# Patient Record
Sex: Female | Born: 1950 | Race: White | Hispanic: No | Marital: Married | Smoking: Never smoker
Health system: Southern US, Community
[De-identification: ages and names within clinical notes are randomized; demographics above are authoritative.]

## PROBLEM LIST (undated history)

## (undated) DIAGNOSIS — G20A1 Parkinson's disease without dyskinesia, without mention of fluctuations: Secondary | ICD-10-CM

---

## 2008-08-13 ENCOUNTER — Ambulatory Visit: Payer: Self-pay | Admitting: Gastroenterology

## 2008-09-10 ENCOUNTER — Ambulatory Visit: Payer: Self-pay | Admitting: Gastroenterology

## 2009-05-22 ENCOUNTER — Ambulatory Visit: Payer: Self-pay | Admitting: Neurology

## 2011-06-13 ENCOUNTER — Emergency Department: Payer: Self-pay | Admitting: Emergency Medicine

## 2012-03-13 DIAGNOSIS — M545 Low back pain: Secondary | ICD-10-CM

## 2012-03-13 DIAGNOSIS — M542 Cervicalgia: Secondary | ICD-10-CM

## 2012-03-13 DIAGNOSIS — F1190 Opioid use, unspecified, uncomplicated: Secondary | ICD-10-CM | POA: Insufficient documentation

## 2012-03-13 DIAGNOSIS — F329 Major depressive disorder, single episode, unspecified: Secondary | ICD-10-CM | POA: Insufficient documentation

## 2012-03-13 DIAGNOSIS — G8929 Other chronic pain: Secondary | ICD-10-CM | POA: Insufficient documentation

## 2012-08-08 DIAGNOSIS — IMO0002 Reserved for concepts with insufficient information to code with codable children: Secondary | ICD-10-CM | POA: Insufficient documentation

## 2012-09-28 ENCOUNTER — Ambulatory Visit: Payer: Self-pay | Admitting: Family Medicine

## 2013-05-14 DIAGNOSIS — H5711 Ocular pain, right eye: Secondary | ICD-10-CM | POA: Insufficient documentation

## 2014-07-06 IMAGING — CT CT ABDOMEN W/ CM
1 of 2 series · 15 of 32 positions shown, 20 images · IV contrast (isovue)
Comparison: none

REASON FOR EXAM: elevated  LFTs  eval fatty liver or cirrhosis
COMMENTS:

PROCEDURE:     CT  - CT ABDOMEN STANDARD W  - September 28, 2012 [DATE]
RESULT:     Comparison: None
TECHNIQUE: Multiple axial images of the abdomen were performed from the lung
bases to the iliac crests, with p.o. contrast and with 85 mL of Isovue 370
intravenous contrast.

[Series 2: 3mm soft tissue · axial · 0.74mm/px · z∈[-832,-589]mm · 15 of 91 slices shown, 20 images]
[im 5/91  soft-tissue]
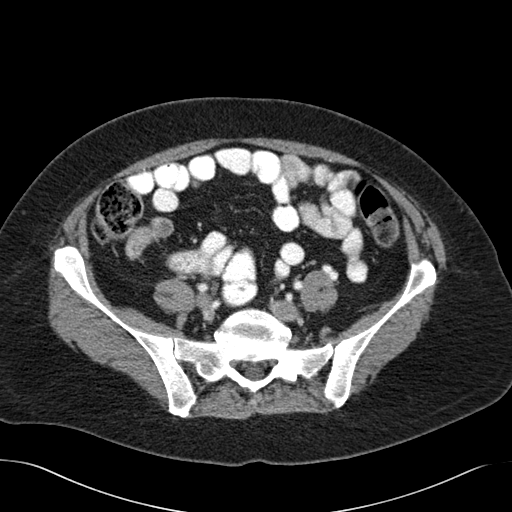
[im 5/91  bone]
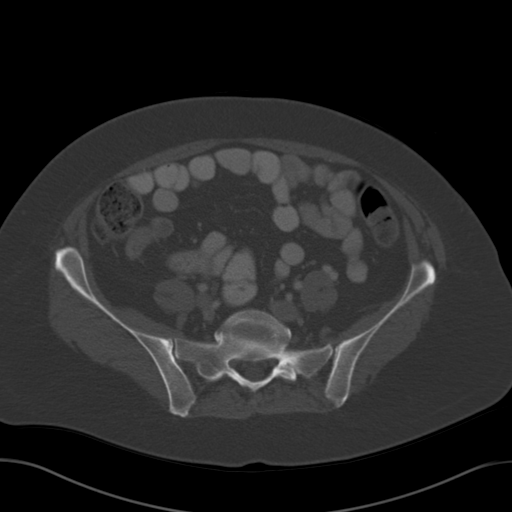
[im 13/91  soft-tissue]
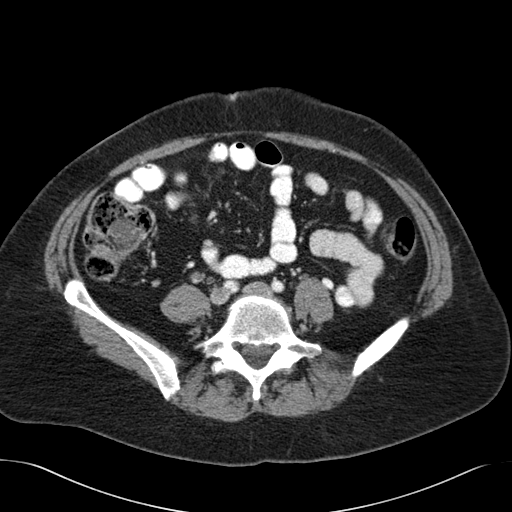
[im 18/91  soft-tissue]
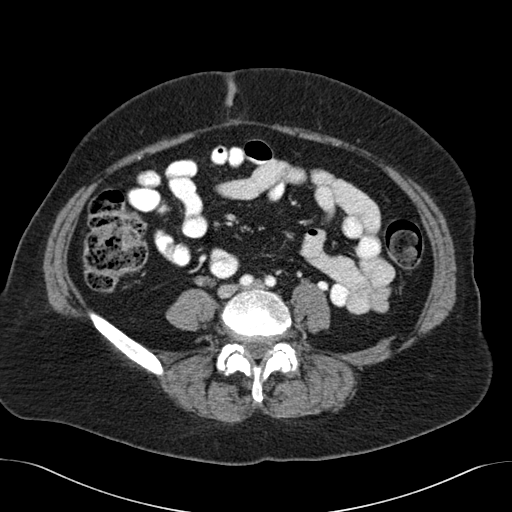
[im 26/91  soft-tissue]
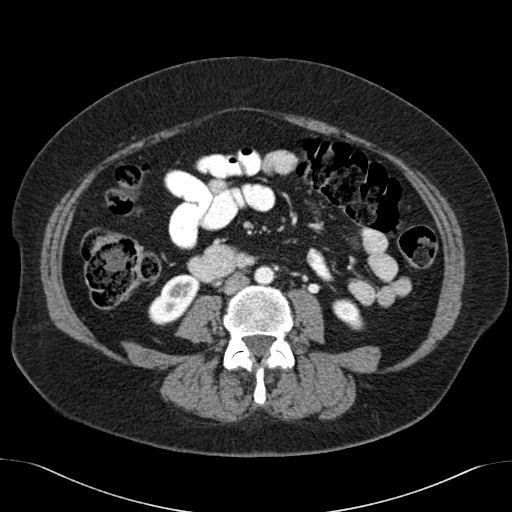
[im 31/91  soft-tissue]
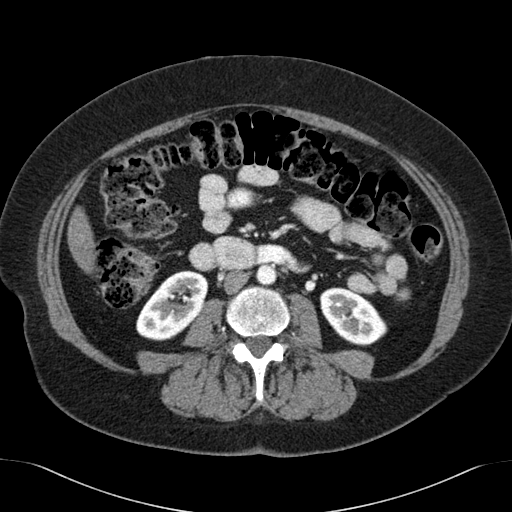
[im 35/91  soft-tissue]
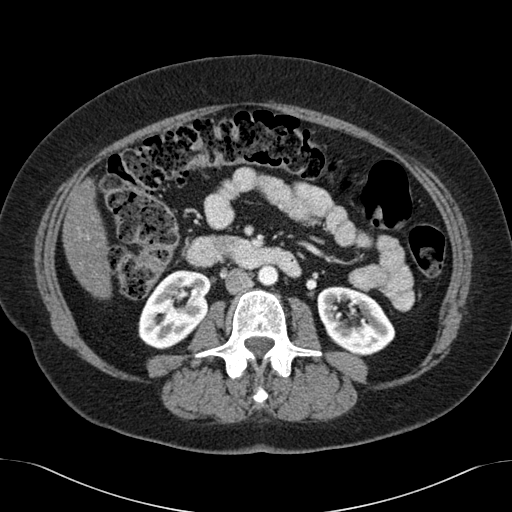
[im 43/91  soft-tissue]
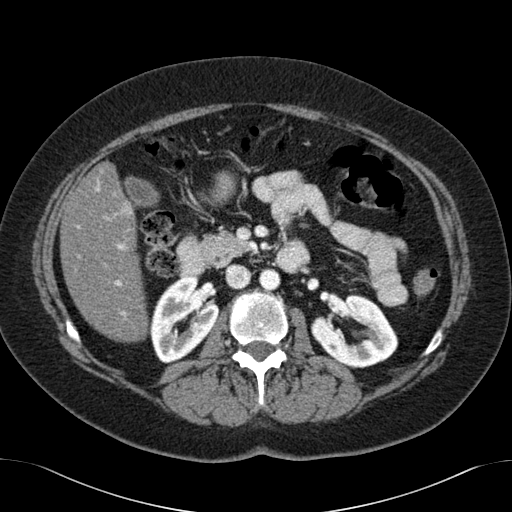
[im 48/91  soft-tissue]
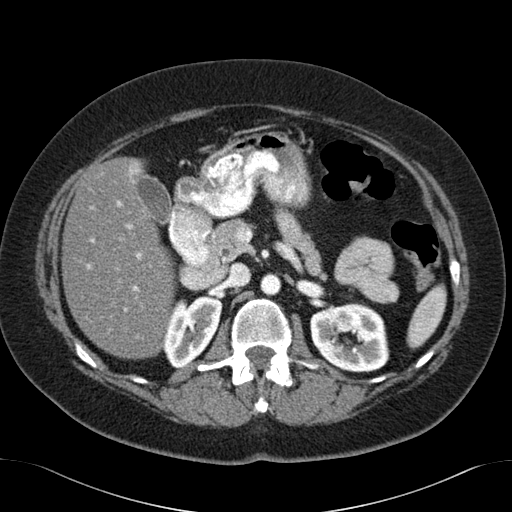
[im 56/91  soft-tissue]
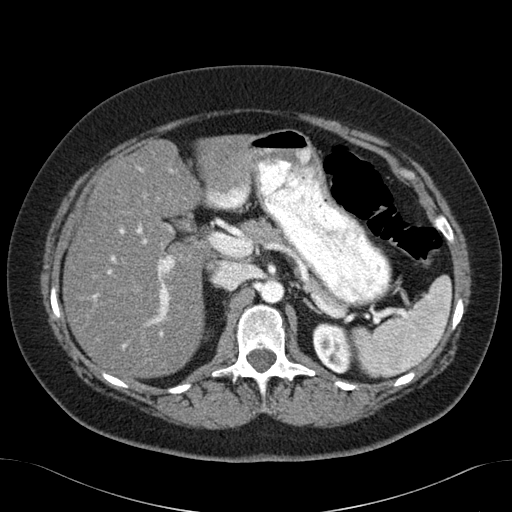
[im 56/91  bone]
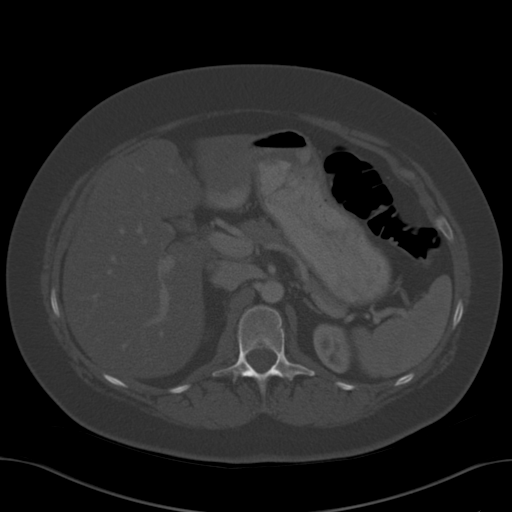
[im 61/91  soft-tissue]
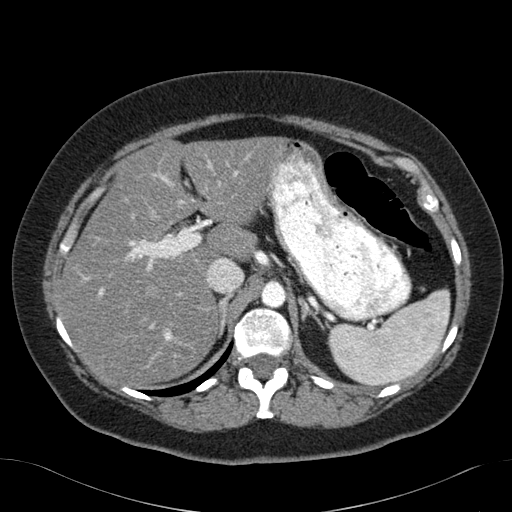
[im 69/91  soft-tissue]
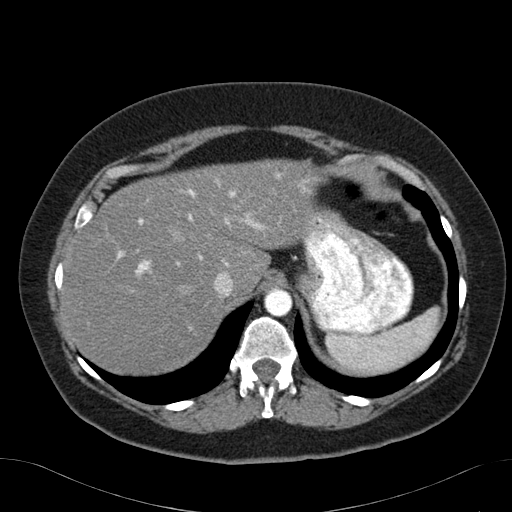
[im 73/91  soft-tissue]
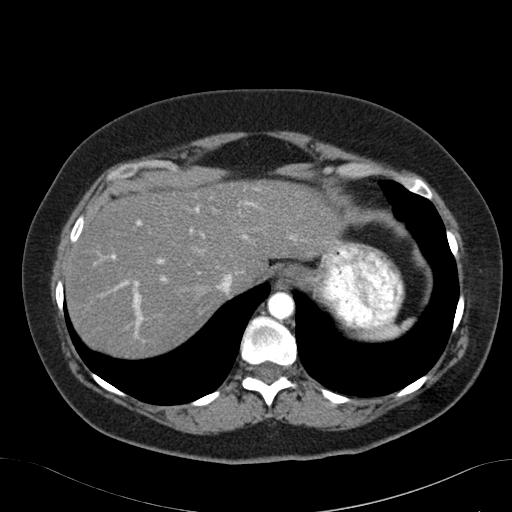
[im 73/91  lung]
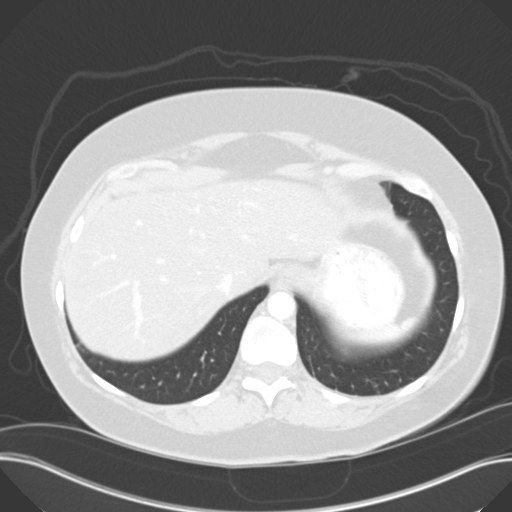
[im 78/91  soft-tissue]
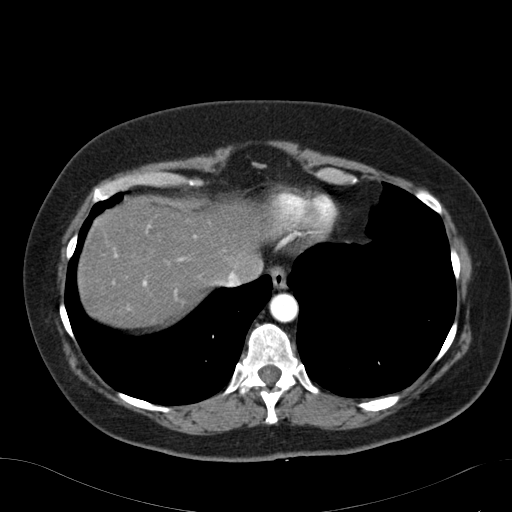
[im 78/91  lung]
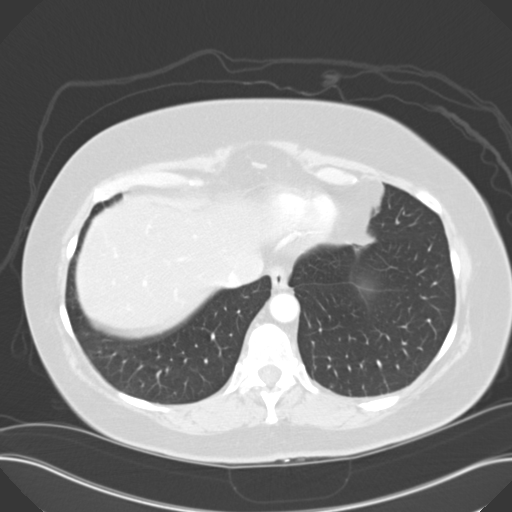
[im 82/91  lung]
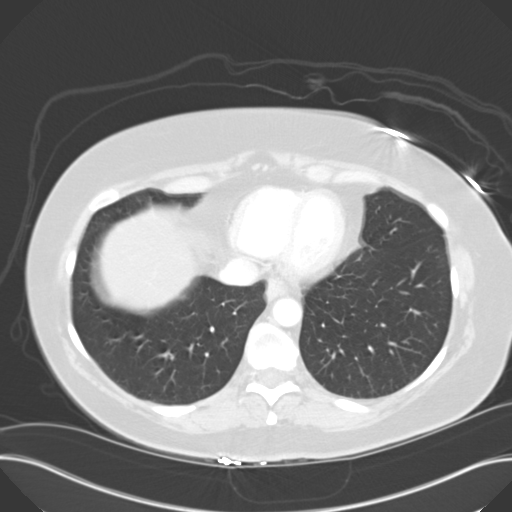
[im 86/91  soft-tissue]
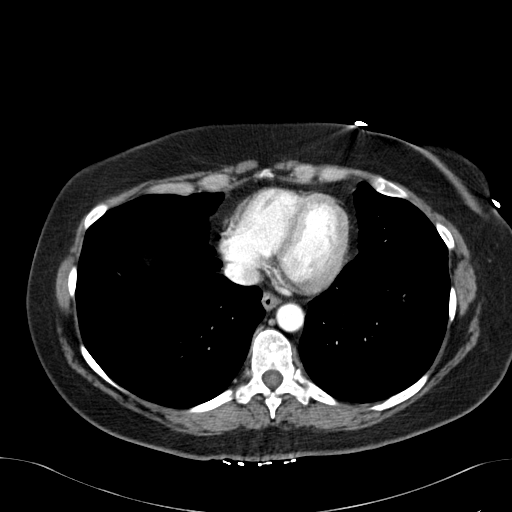
[im 86/91  lung]
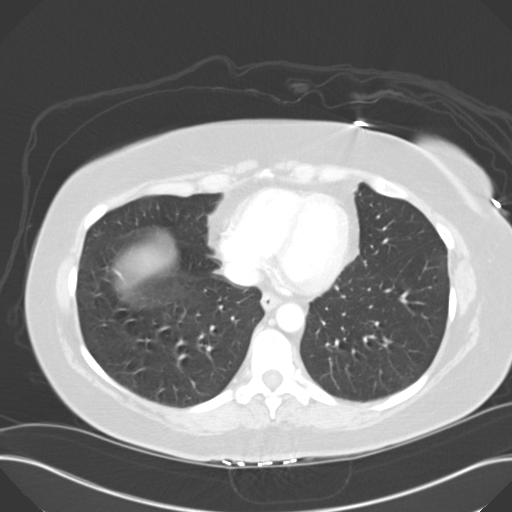

[15 of 32 positions shown; findings below may reference images not displayed]

FINDINGS: The liver is diffusely low in attenuation, consistent with hepatic
steatosis. Slight hyperattenuation adjacent the gallbladder fossa is likely
related to focal fatty sparing. The gallbladder, spleen, adrenals, and
pancreas are unremarkable. The kidneys enhance normally.

The visualized small and large bowel are normal in caliber.

No aggressive lytic or sclerotic osseous lesions are identified.
IMPRESSION: Hepatic steatosis.

## 2016-06-03 DIAGNOSIS — G20 Parkinson's disease: Secondary | ICD-10-CM | POA: Insufficient documentation

## 2016-06-03 DIAGNOSIS — F5104 Psychophysiologic insomnia: Secondary | ICD-10-CM | POA: Insufficient documentation

## 2017-12-15 ENCOUNTER — Encounter (HOSPITAL_COMMUNITY): Payer: Self-pay | Admitting: Nurse Practitioner

## 2017-12-16 ENCOUNTER — Other Ambulatory Visit (HOSPITAL_COMMUNITY): Payer: Self-pay | Admitting: Nurse Practitioner

## 2017-12-16 DIAGNOSIS — R1011 Right upper quadrant pain: Secondary | ICD-10-CM

## 2017-12-19 ENCOUNTER — Ambulatory Visit (HOSPITAL_COMMUNITY): Payer: BC Managed Care – PPO

## 2017-12-22 ENCOUNTER — Ambulatory Visit
Admission: RE | Admit: 2017-12-22 | Discharge: 2017-12-22 | Disposition: A | Discharge status: Home / Self Care | Payer: BC Managed Care – PPO | Source: Ambulatory Visit | Attending: Nurse Practitioner | Admitting: Nurse Practitioner

## 2017-12-22 DIAGNOSIS — R1011 Right upper quadrant pain: Secondary | ICD-10-CM | POA: Diagnosis not present

## 2017-12-22 DIAGNOSIS — I700 Atherosclerosis of aorta: Secondary | ICD-10-CM | POA: Insufficient documentation

## 2018-01-31 ENCOUNTER — Encounter: Payer: Self-pay | Admitting: Gastroenterology

## 2018-03-21 ENCOUNTER — Telehealth: Payer: Self-pay | Admitting: Gastroenterology

## 2018-03-21 ENCOUNTER — Encounter: Payer: Self-pay | Admitting: Gastroenterology

## 2018-03-21 ENCOUNTER — Ambulatory Visit: Payer: BC Managed Care – PPO | Admitting: Gastroenterology

## 2018-03-21 NOTE — Telephone Encounter (Signed)
PATIENT WAS A NO SHOW AND LETTER SENT  °

## 2018-03-25 IMAGING — US US ABDOMEN COMPLETE
1 series · 13 of 25 positions shown · non-contrast
Comparison: CT abdomen and pelvis September 28, 2012

CLINICAL DATA: Upper abdominal pain with elevated liver enzymes

EXAM:
ABDOMEN ULTRASOUND COMPLETE

[Series 1: us abdomen complete · 0.19mm/px · 13 of 96 slices shown]
[im 1/96]
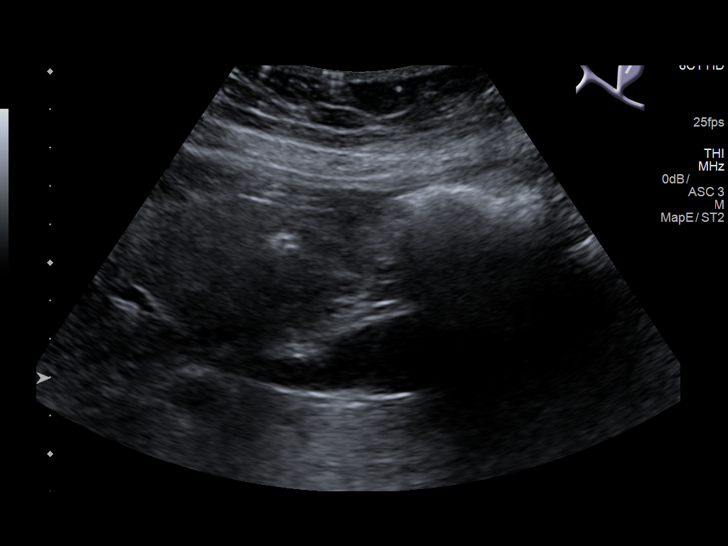
[im 8/96]
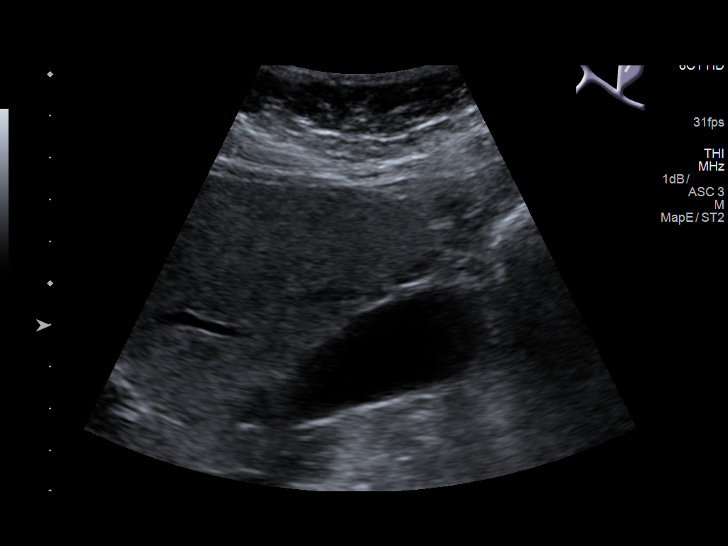
[im 16/96]
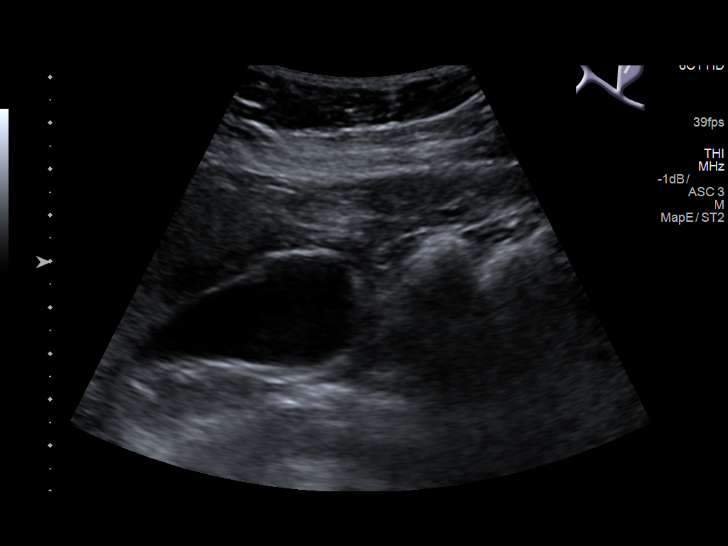
[im 24/96]
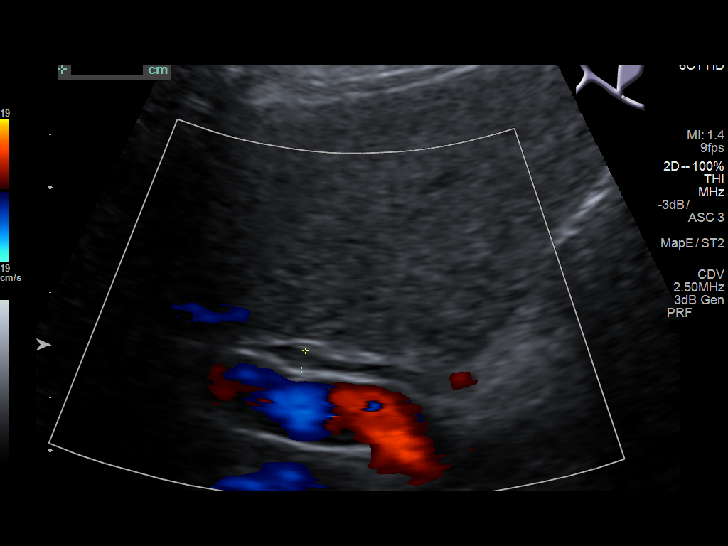
[im 32/96]
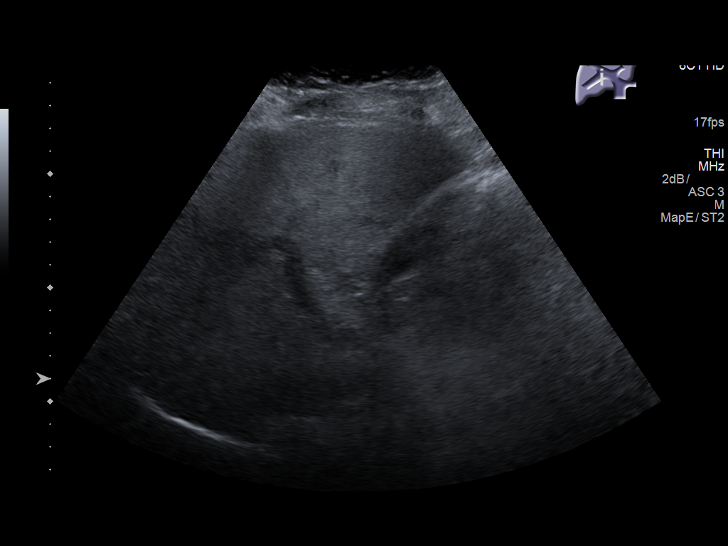
[im 40/96]
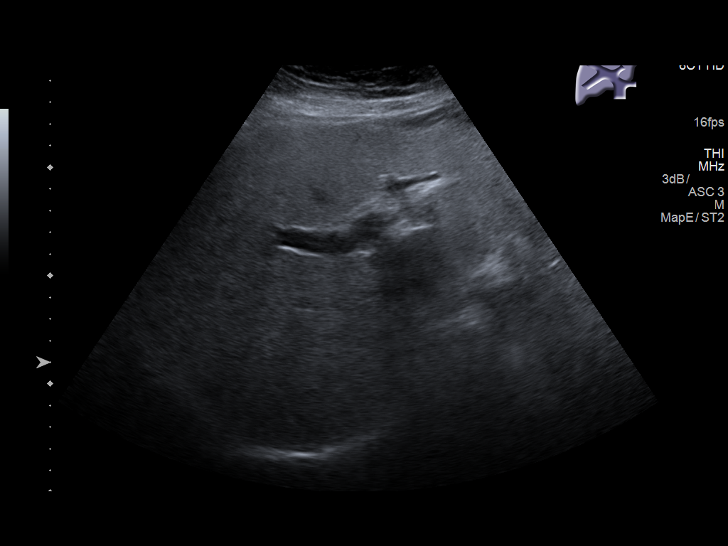
[im 48/96]
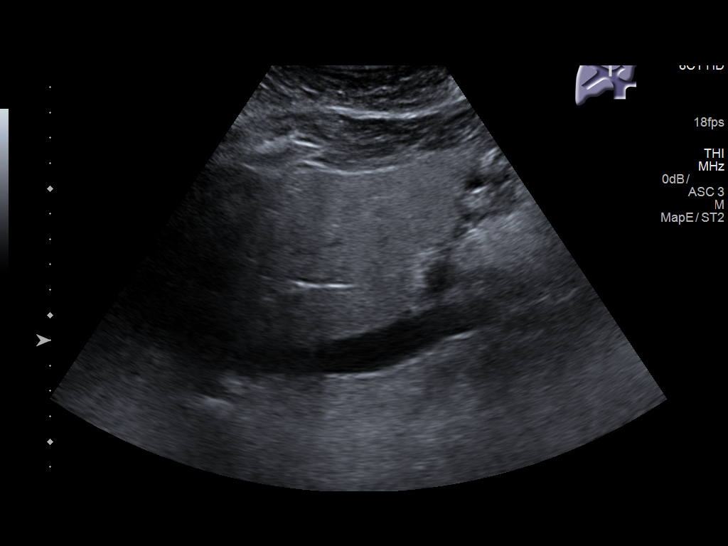
[im 56/96]
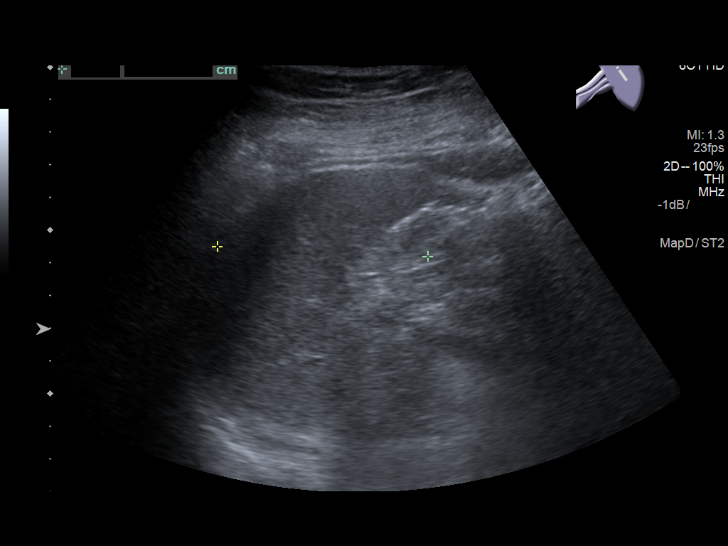
[im 64/96]
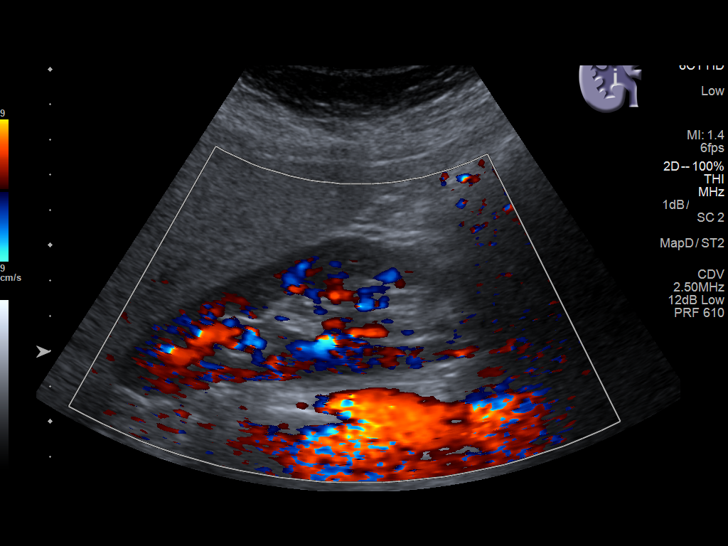
[im 72/96]
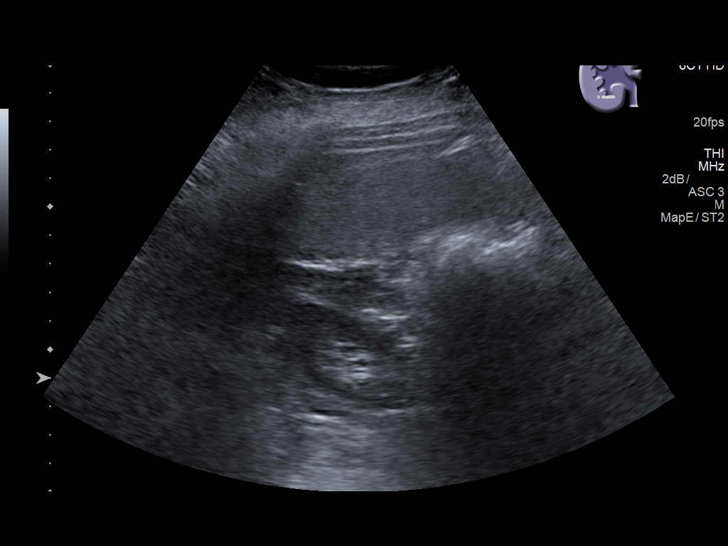
[im 80/96]
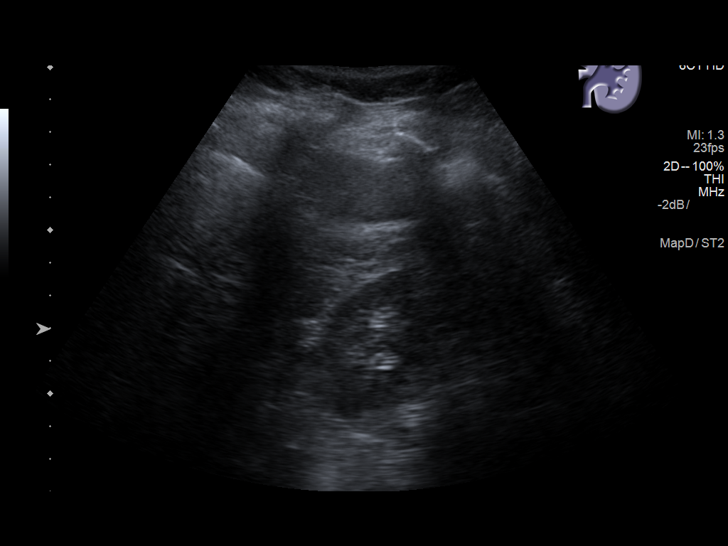
[im 88/96]
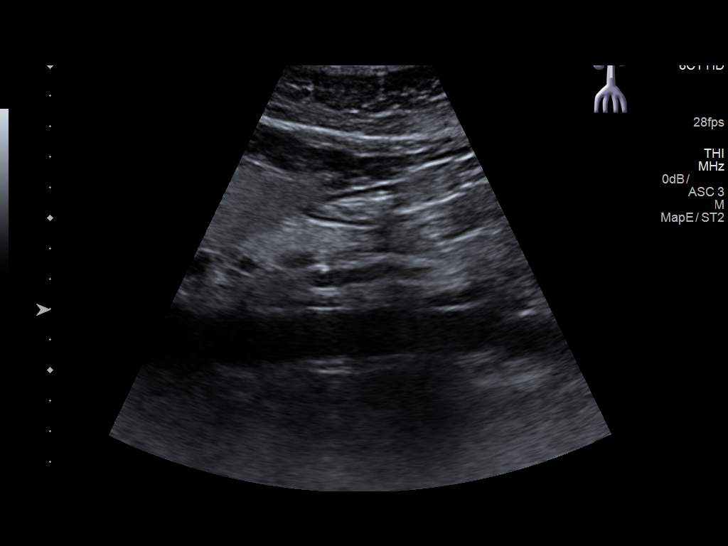
[im 96/96]
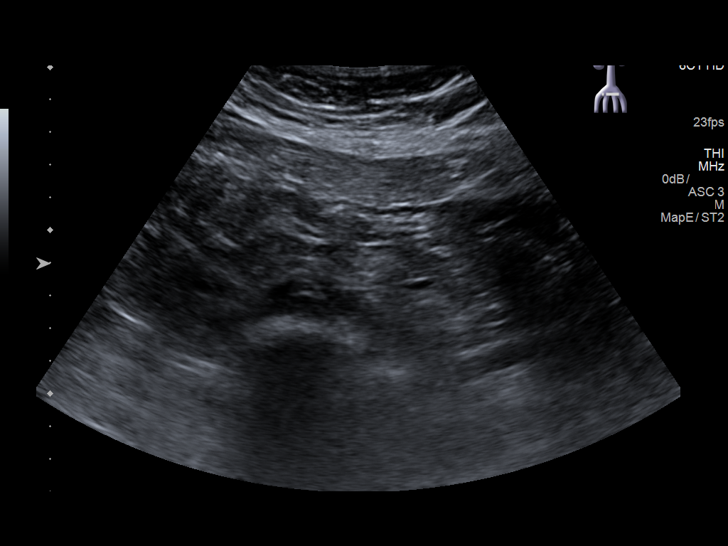

[13 of 25 positions shown; findings below may reference images not displayed]

FINDINGS: Gallbladder: There is an echogenic focus along the fundus of the
gallbladder measuring 3 mm which shows subtle comet tail type
artifact, likely representing a small focus of inflammation such as
adenomyomatosis or cholesterolosis. There are no echogenic foci in
the gallbladder which move and shadow as is expected gallstones.
There is no gallbladder wall thickening or pericholecystic fluid. No
sonographic Murphy sign noted by sonographer.

Common bile duct: Diameter: 4 mm. No intrahepatic, common hepatic,
or common bile duct dilatation.

Liver: No focal lesion identified. Liver echogenicity overall is
increased diffusely.. Portal vein is patent on color Doppler imaging
with normal direction of blood flow towards the liver.

IVC: No abnormality visualized.

Pancreas: Visualized portion unremarkable. Portions of pancreas
obscured by gas.

Spleen: Size and appearance within normal limits.

Right Kidney: Length: 9.8 cm. Echogenicity within normal limits. No
mass or hydronephrosis visualized.

Left Kidney: Length: 11.0 cm. Echogenicity within normal limits. No
mass or hydronephrosis visualized.

Abdominal aorta: No aneurysm visualized. There is aortic
atherosclerosis.

Other findings: No demonstrable ascites.
IMPRESSION: 1. Focus of probable adenomyomatosis or cholesterolosis along the
fundus of the gallbladder. Gallbladder otherwise appears normal.

2. Increased liver echogenicity is most likely indicative of hepatic
steatosis. While no focal liver lesions are evident on this study,
must be cautioned that the sensitivity of ultrasound for detection
of focal liver lesions is diminished in this circumstance.

3. Portions of pancreas obscured by gas. Visualized portions of
pancreas appear normal.

4.  Aortic atherosclerosis.

Aortic Atherosclerosis (ITKC8-T86.6).

## 2018-06-14 ENCOUNTER — Encounter: Payer: Self-pay | Admitting: Gastroenterology

## 2018-06-14 ENCOUNTER — Ambulatory Visit (INDEPENDENT_AMBULATORY_CARE_PROVIDER_SITE_OTHER): Payer: BC Managed Care – PPO | Admitting: Gastroenterology

## 2018-06-14 ENCOUNTER — Other Ambulatory Visit: Payer: Self-pay

## 2018-06-14 VITALS — BP 117/67 | HR 89 | Ht 66.0 in

## 2018-06-14 DIAGNOSIS — G8929 Other chronic pain: Secondary | ICD-10-CM

## 2018-06-14 DIAGNOSIS — R1011 Right upper quadrant pain: Secondary | ICD-10-CM

## 2018-06-14 DIAGNOSIS — R748 Abnormal levels of other serum enzymes: Secondary | ICD-10-CM | POA: Diagnosis not present

## 2018-06-14 NOTE — Progress Notes (Signed)
Gastroenterology Consultation  Referring Provider:     Lucia Estelle, NP Primary Care Physician:  Lucia Estelle, NP Primary Gastroenterologist:  Dr. Servando Snare     Reason for Consultation:     Right upper quadrant pain        HPI:   Cheryl Russell is a 67 y.o. y/o female referred for consultation & management of right upper quadrant pain by Dr. Lucia Estelle, NP.  This patient comes today with a history of Parkinson's disease and right upper quadrant pain.  The patient had similar symptoms reported back in January and was reported at times to have abnormal liver enzymes.  The patient was sent for an ultrasound of the abdomen and there was a focus of probable adenomyomatosis or cholesterolosis along the fundus of the gallbladder.  There is also signs of fatty liver.  The patient also had LFTs in the 100s back in 2013.  The patient also suffers from restless leg syndrome. The patient was seen by Dr. Marva Panda back in 2009 and had a gallbladder emptying study at that time with a 70% ejection fraction.   On further questioning the patient also reports that she has pain in her lower back and her and multiple other pin point locations all over her body.  She points to her gallbladder pain at the mid Axillary line and not near the gallbladder.  Fatty foods and greasy foods do not make her symptoms worse. She cannot see any exacerbating or relieving Triggers. She also reports pain in the costochondral margin   History reviewed. No pertinent past medical history.  History reviewed. No pertinent surgical history.  Prior to Admission medications   Not on File    History reviewed. No pertinent family history.   Social History   Tobacco Use  . Smoking status: Never Smoker  . Smokeless tobacco: Never Used  Substance Use Topics  . Alcohol use: Yes    Comment: OCCASIONAL  . Drug use: Never    Allergies as of 06/14/2018  . (No Known Allergies)    Review of Systems:    All systems reviewed and  negative except where noted in HPI.   Physical Exam:  BP 117/67   Pulse 89   Ht 5\' 6"  (1.676 m)  No LMP recorded. General:   Alert,  Well-developed, well-nourished, pleasant and cooperative in NAD Head:  Normocephalic and atraumatic. Eyes:  Sclera clear, no icterus.   Conjunctiva pink. Ears:  Normal auditory acuity. Nose:  No deformity, discharge, or lesions. Mouth:  No deformity or lesions,oropharynx pink & moist. Neck:  Supple; no masses or thyromegaly. Lungs:  Respirations even and unlabored.  Clear throughout to auscultation.   No wheezes, crackles, or rhonchi. No acute distress. Heart:  Regular rate and rhythm; no murmurs, clicks, rubs, or gallops. Abdomen:  Normal bowel sounds.  No bruits.  Soft, non-tender and non-distended without masses, hepatosplenomegaly or hernias noted.  No guarding or rebound tenderness.  Negative Carnett sign.   Rectal:  Deferred.  Msk:  Symmetrical without gross deformities.  Good, equal movement & strength bilaterally. Pulses:  Normal pulses noted. Extremities:  No clubbing or edema.  No cyanosis. Neurologic:  Alert and oriented x3;  grossly normal neurologically. Skin:  Intact without significant lesions or rashes.  No jaundice. Lymph Nodes:  No significant cervical adenopathy. Psych:  Alert and cooperative. Normal mood and affect.  Imaging Studies: No results found.  Assessment and Plan:   Cheryl Russell is a 67 y.o. y/o  female Who comes in with right-sided abdominal pain in addition to multiple sites of pain all over her body including her hips her back in the lower back and at this CVA junction.  The patient has had a workup for her gallbladder in the past and has a history of abnormal liver enzymes.  The patient will have her labs sent off for repeat liver enzymes and for possible causes of abnormal liver enzymes. The patient also was found to have abnormalities on her ultrasound which should be evaluated by surgery to see if the gallbladder  needs to be removed.  The patient has been explained the plan and agrees with it.  Midge Miniumarren Sha Amer, MD. Clementeen GrahamFACG    Note: This dictation was prepared with Dragon dictation along with smaller phrase technology. Any transcriptional errors that result from this process are unintentional.

## 2018-06-17 LAB — HEPATITIS B SURFACE ANTIGEN: Hepatitis B Surface Ag: NEGATIVE

## 2018-06-17 LAB — ANA: ANA: NEGATIVE

## 2018-06-17 LAB — HEPATITIS A ANTIBODY, TOTAL: HEP A TOTAL AB: NEGATIVE

## 2018-06-17 LAB — IRON AND TIBC
IRON SATURATION: 33 % (ref 15–55)
IRON: 102 ug/dL (ref 27–139)
Total Iron Binding Capacity: 306 ug/dL (ref 250–450)
UIBC: 204 ug/dL (ref 118–369)

## 2018-06-17 LAB — HEPATIC FUNCTION PANEL
ALT: 21 IU/L (ref 0–32)
AST: 40 IU/L (ref 0–40)
Albumin: 4.4 g/dL (ref 3.6–4.8)
Alkaline Phosphatase: 87 IU/L (ref 39–117)
Bilirubin Total: 0.9 mg/dL (ref 0.0–1.2)
Bilirubin, Direct: 0.19 mg/dL (ref 0.00–0.40)
Total Protein: 6.8 g/dL (ref 6.0–8.5)

## 2018-06-17 LAB — HEPATITIS C ANTIBODY

## 2018-06-17 LAB — ANTI-SMOOTH MUSCLE ANTIBODY, IGG: SMOOTH MUSCLE AB: 6 U (ref 0–19)

## 2018-06-17 LAB — ALPHA-1-ANTITRYPSIN: A-1 Antitrypsin: 127 mg/dL (ref 90–200)

## 2018-06-17 LAB — FERRITIN: Ferritin: 281 ng/mL — ABNORMAL HIGH (ref 15–150)

## 2018-06-17 LAB — CERULOPLASMIN: Ceruloplasmin: 30.6 mg/dL (ref 19.0–39.0)

## 2018-06-17 LAB — HEPATITIS B SURFACE ANTIBODY,QUALITATIVE: Hep B Surface Ab, Qual: NONREACTIVE

## 2018-06-17 LAB — MITOCHONDRIAL ANTIBODIES: Mitochondrial Ab: <20 Units (ref 0.0–20.0)

## 2018-06-21 ENCOUNTER — Telehealth: Payer: Self-pay

## 2018-06-21 NOTE — Telephone Encounter (Signed)
-----   Message from Midge Miniumarren Wohl, MD sent at 06/20/2018  2:38 PM EDT ----- Let the atient know that her liver enzymes are now normal. She is not protected against hep A or B and should be set up for immunization of these due to her fatty liver.

## 2018-06-21 NOTE — Telephone Encounter (Signed)
LVM for pt to return my call.

## 2018-06-23 NOTE — Telephone Encounter (Signed)
Pt is returning call for Ginger for results

## 2018-06-23 NOTE — Telephone Encounter (Signed)
Left vm with results. Advised pt to contact our office to schedule a nurse visit for Hep A and Hep B vaccines or contact her PCP to set up.

## 2023-05-10 ENCOUNTER — Other Ambulatory Visit: Payer: Self-pay

## 2023-05-10 ENCOUNTER — Emergency Department (HOSPITAL_COMMUNITY)
Admission: EM | Admit: 2023-05-10 | Discharge: 2023-05-11 | Disposition: A | Discharge status: Home / Self Care | Payer: BC Managed Care – PPO | Attending: Emergency Medicine | Admitting: Emergency Medicine

## 2023-05-10 ENCOUNTER — Emergency Department (HOSPITAL_COMMUNITY): Payer: BC Managed Care – PPO

## 2023-05-10 ENCOUNTER — Encounter (HOSPITAL_COMMUNITY): Payer: Self-pay | Admitting: Emergency Medicine

## 2023-05-10 DIAGNOSIS — M79662 Pain in left lower leg: Secondary | ICD-10-CM | POA: Diagnosis present

## 2023-05-10 DIAGNOSIS — G20C Parkinsonism, unspecified: Secondary | ICD-10-CM | POA: Insufficient documentation

## 2023-05-10 HISTORY — DX: Parkinson's disease without dyskinesia, without mention of fluctuations: G20.A1

## 2023-05-10 LAB — CBC WITH DIFFERENTIAL/PLATELET
Abs Immature Granulocytes: 0.04 10*3/uL (ref 0.00–0.07)
Basophils Absolute: 0.1 10*3/uL (ref 0.0–0.1)
Basophils Relative: 1 %
Eosinophils Absolute: 0.2 10*3/uL (ref 0.0–0.5)
Eosinophils Relative: 2 %
HCT: 42.3 % (ref 36.0–46.0)
Hemoglobin: 13.4 g/dL (ref 12.0–15.0)
Immature Granulocytes: 0 %
Lymphocytes Relative: 40 %
Lymphs Abs: 4.2 10*3/uL — ABNORMAL HIGH (ref 0.7–4.0)
MCH: 29.8 pg (ref 26.0–34.0)
MCHC: 31.7 g/dL (ref 30.0–36.0)
MCV: 94.2 fL (ref 80.0–100.0)
Monocytes Absolute: 0.8 10*3/uL (ref 0.1–1.0)
Monocytes Relative: 7 %
Neutro Abs: 5.4 10*3/uL (ref 1.7–7.7)
Neutrophils Relative %: 50 %
Platelets: 259 10*3/uL (ref 150–400)
RBC: 4.49 MIL/uL (ref 3.87–5.11)
RDW: 12.9 % (ref 11.5–15.5)
WBC: 10.6 10*3/uL — ABNORMAL HIGH (ref 4.0–10.5)
nRBC: 0 % (ref 0.0–0.2)

## 2023-05-10 LAB — COMPREHENSIVE METABOLIC PANEL
ALT: 6 U/L (ref 0–44)
AST: 20 U/L (ref 15–41)
Albumin: 3.9 g/dL (ref 3.5–5.0)
Alkaline Phosphatase: 77 U/L (ref 38–126)
Anion gap: 10 (ref 5–15)
BUN: 18 mg/dL (ref 8–23)
CO2: 26 mmol/L (ref 22–32)
Calcium: 9.2 mg/dL (ref 8.9–10.3)
Chloride: 103 mmol/L (ref 98–111)
Creatinine, Ser: 0.84 mg/dL (ref 0.44–1.00)
GFR, Estimated: >60 mL/min (ref >60)
Glucose, Bld: 89 mg/dL (ref 70–99)
Potassium: 4 mmol/L (ref 3.5–5.1)
Sodium: 139 mmol/L (ref 135–145)
Total Bilirubin: 1 mg/dL (ref 0.3–1.2)
Total Protein: 7 g/dL (ref 6.5–8.1)

## 2023-05-10 LAB — TROPONIN I (HIGH SENSITIVITY)
Troponin I (High Sensitivity): 2 ng/L (ref <18)
Troponin I (High Sensitivity): 2 ng/L (ref <18)

## 2023-05-10 MED ORDER — ALBUTEROL SULFATE HFA 108 (90 BASE) MCG/ACT IN AERS: 2.0000 | INHALATION_SPRAY | RESPIRATORY_TRACT | Status: DC | PRN

## 2023-05-10 MED ORDER — ALBUTEROL SULFATE (2.5 MG/3ML) 0.083% IN NEBU: 2.5000 mg | INHALATION_SOLUTION | RESPIRATORY_TRACT | Status: DC | PRN

## 2023-05-10 NOTE — ED Triage Notes (Addendum)
Pt c/o swelling to bilateral hands and feet. Hx of Parkinson's; called [facility] and spoke with a nurse who advised that she come here to rule out blood clots. No prior hx DVT or PE. Pt did have sharp pain to left calf for several months but that pain has resolved. She had been wearing compression socks that were too tight but after changing to a larger size the pain has improved. Currently denies pain in hands and feet but states intermittent pain 7/10 noted. Pt denies SOB, CP, dizziness, lightheadedness.

## 2023-05-11 NOTE — Discharge Instructions (Signed)
Return tomorrow at the given time for an ultrasound of your leg.  Continue medications as previously prescribed.  Follow-up with primary doctor in the next few days, and return to the ER if symptoms significantly worsen or change.

## 2023-05-11 NOTE — ED Provider Notes (Signed)
[place] EMERGENCY DEPARTMENT AT [facility] Provider Note   CSN: [id number] Arrival date & time: 05/10/23  1651     History  Chief Complaint  Patient presents with   Joint Swelling    Cheryl Russell is a 72 y.o. female.  Patient is a 72 year old female with past medical history of Parkinson's disease, insomnia, chronic pain syndrome.  Patient presenting today with complaints of left calf pain.  This has been ongoing for several weeks.  Symptoms actually improved somewhat after wearing compression hose.  Over the past few days, she started noticing discomfort in both arms and index fingers that occurred in the absence of any injury or trauma.  She called the [facility] triage line and was advised to come to the ER to have a blood clot ruled out.  Patient denies to me he is having any chest pain or shortness of breath.  No aggravating or alleviating factors.  The history is provided by the patient.       Home Medications Prior to Admission medications   Medication Sig Start Date End Date Taking? Authorizing Provider  carbidopa-levodopa (SINEMET CR) 50-200 MG tablet  05/29/18   [provider]  carbidopa-levodopa (SINEMET IR) 25-100 MG tablet  05/29/18   [provider]  cyclobenzaprine (FLEXERIL) 10 MG tablet  05/29/18   [provider]  cyclobenzaprine (FLEXERIL) 10 MG tablet Take one-half to one tablet daily as needed for muscle spasms 12/24/16   [provider]  rOPINIRole (REQUIP) 1 MG tablet  05/29/18   [provider]  rOPINIRole (REQUIP) 1 MG tablet Take 1 mg once or twice daily as needed for uncontrolled Parkinson's/RLS symptoms. 10/31/17   [provider]  Ropinirole HCl 6 MG TB24 Take by mouth. 10/31/17 10/31/18  [provider]  zolpidem (AMBIEN) 5 MG tablet  05/29/18   [provider]      Allergies    Patient has no known allergies.    Review of Systems   Review of Systems  All other systems  reviewed and are negative.   Physical Exam Updated Vital Signs BP (!) 131/98 (BP Location: Right Arm)   Pulse 75   Temp 98.4 F (36.9 C) (Oral)   Resp 14   Ht 5\' 6"  (1.676 m)   Wt 90.7 kg   SpO2 94%   BMI 32.28 kg/m  Physical Exam Vitals and nursing note reviewed.  Constitutional:      Appearance: Normal appearance.  Pulmonary:     Effort: Pulmonary effort is normal.  Musculoskeletal:     Comments: The left lower extremity appears grossly normal and symmetrical to the right.  There is minimal, if any edema.  DP pulses are easily palpable and motor and sensation are intact throughout both feet.  Denna Haggard' sign is absent.  Skin:    General: Skin is warm and dry.  Neurological:     Mental Status: She is alert and oriented to person, place, and time.     Comments: Baseline tremor is noted.  Otherwise strength is 5 out of 5 in both upper and lower extremities.     ED Results / Procedures / Treatments   Labs (all labs ordered are listed, but only abnormal results are displayed) Labs Reviewed  CBC WITH DIFFERENTIAL/PLATELET - Abnormal; Notable for the following components:      Result Value   WBC 10.6 (*)    Lymphs Abs 4.2 (*)    All other components within normal limits  COMPREHENSIVE METABOLIC PANEL  TROPONIN I (HIGH SENSITIVITY)  TROPONIN I (HIGH SENSITIVITY)    EKG EKG Interpretation  Date/Time:  Tuesday May 10 2023 17:26:06 EDT Ventricular Rate:  80 PR Interval:  176 QRS Duration: 84 QT Interval:  346 QTC Calculation: 399 R Axis:   47 Text Interpretation: Normal sinus rhythm Normal ECG When compared with ECG of 13-Jun-2011 04:14, No significant change was found Confirmed by Meridee Score [id number]) on 05/10/2023 6:48:03 PM  Radiology DG Chest 2 View  Result Date: 05/10/2023 CLINICAL DATA:  Shortness of breath EXAM: CHEST - 2 VIEW COMPARISON:  06/13/2011 FINDINGS: The heart size and mediastinal contours are within normal limits. Both lungs are clear. The  visualized skeletal structures are unremarkable. IMPRESSION: No active cardiopulmonary disease. Electronically Signed   By: Alcide Clever M.D.   On: 05/10/2023 18:13    Procedures Procedures    Medications Ordered in ED Medications  albuterol (PROVENTIL) (2.5 MG/3ML) 0.083% nebulizer solution 2.5 mg (has no administration in time range)    ED Course/ Medical Decision Making/ A&P  Patient presenting with complaints as described in the HPI.  Patient arrives here with stable vital signs and is afebrile.  Physical examination basically unremarkable.  Laboratory studies obtained including CBC, metabolic panel, and troponin x 2, all of which are unremarkable.  Chest x-ray is clear.  I am uncertain as to the etiology of the patient's leg discomfort, but I will obtain an ultrasound to rule out DVT.  This study will have to be performed in the morning as they are gone for the day.  Final Clinical Impression(s) / ED Diagnoses Final diagnoses:  None    Rx / DC Orders ED Discharge Orders     None         Geoffery Lyons, MD 05/11/23 [id number]

## 2024-03-18 ENCOUNTER — Other Ambulatory Visit: Payer: Self-pay

## 2024-03-18 ENCOUNTER — Emergency Department
Admission: EM | Admit: 2024-03-18 | Discharge: 2024-03-18 | Disposition: A | Discharge status: Home / Self Care | Attending: Emergency Medicine | Admitting: Emergency Medicine

## 2024-03-18 ENCOUNTER — Emergency Department

## 2024-03-18 DIAGNOSIS — M542 Cervicalgia: Secondary | ICD-10-CM | POA: Diagnosis not present

## 2024-03-18 DIAGNOSIS — G4763 Sleep related bruxism: Secondary | ICD-10-CM | POA: Insufficient documentation

## 2024-03-18 DIAGNOSIS — R079 Chest pain, unspecified: Secondary | ICD-10-CM | POA: Diagnosis present

## 2024-03-18 DIAGNOSIS — G20C Parkinsonism, unspecified: Secondary | ICD-10-CM | POA: Diagnosis not present

## 2024-03-18 LAB — BASIC METABOLIC PANEL WITH GFR
Anion gap: 8 (ref 5–15)
BUN: 19 mg/dL (ref 8–23)
CO2: 25 mmol/L (ref 22–32)
Calcium: 8.9 mg/dL (ref 8.9–10.3)
Chloride: 106 mmol/L (ref 98–111)
Creatinine, Ser: 0.77 mg/dL (ref 0.44–1.00)
GFR, Estimated: >60 mL/min (ref >60)
Glucose, Bld: 92 mg/dL (ref 70–99)
Potassium: 3.9 mmol/L (ref 3.5–5.1)
Sodium: 139 mmol/L (ref 135–145)

## 2024-03-18 LAB — CBC
HCT: 35.1 % — ABNORMAL LOW (ref 36.0–46.0)
Hemoglobin: 11.3 g/dL — ABNORMAL LOW (ref 12.0–15.0)
MCH: 30.1 pg (ref 26.0–34.0)
MCHC: 32.2 g/dL (ref 30.0–36.0)
MCV: 93.4 fL (ref 80.0–100.0)
Platelets: 270 10*3/uL (ref 150–400)
RBC: 3.76 MIL/uL — ABNORMAL LOW (ref 3.87–5.11)
RDW: 13.4 % (ref 11.5–15.5)
WBC: 8.9 10*3/uL (ref 4.0–10.5)
nRBC: 0 % (ref 0.0–0.2)

## 2024-03-18 LAB — TROPONIN I (HIGH SENSITIVITY)
Troponin I (High Sensitivity): 3 ng/L (ref <18)
Troponin I (High Sensitivity): 3 ng/L (ref <18)

## 2024-03-18 NOTE — ED Notes (Signed)
 ..  The patient is A&OX4, ambulatory at d/c with independent steady gait, NAD. Pt verbalized understanding of d/c instructions and follow up care.

## 2024-03-18 NOTE — ED Triage Notes (Signed)
 Pt comes with cp that started at 2pm today. Pt states left sided cp. Pt states it radiates to arm and neck. Pt does have bruise noted to right breast from a few days ago hitting the table.

## 2024-03-18 NOTE — Discharge Instructions (Signed)
 Your workup was reassuring today with 2 negative heart markers and reassuring EKG.  Chest x-ray with no signs of pneumonia.  Please follow-up with your neurology team and I have sent a referral for you to see orthopedics for your chronic neck and shoulder pain.

## 2024-03-18 NOTE — ED Provider Notes (Signed)
 [facility] Provider Note    Event Date/Time   First MD Initiated Contact with Patient 03/18/24 2207     (approximate)   History   Chest Pain   HPI Cheryl Russell is a 73 y.o. female with Parkinson's presenting today for multiple complaints.  Patient noted some intermittent chest pain in the left upper side which has been coming and going over the past couple of days.  She is also having some neck pain on her right side which has been more chronic.  Also complaining of teeth grinding.  No specific shortness of breath.  Has chronic nausea with no change to that today.  No abdominal pain.  No leg pain or leg swelling.  Patient unsure if this is all just continuation of her Parkinson's and states she has a lot of anxiety.     Physical Exam   Triage Vital Signs: ED Triage Vitals [03/18/24 1700]  Encounter Vitals Group     BP (!) 135/97     Systolic BP Percentile      Diastolic BP Percentile      Pulse Rate 91     Resp 18     Temp 98 F (36.7 C)     Temp src      SpO2 100 %     Weight 198 lb (89.8 kg)     Height 5\' 6"  (1.676 m)     Head Circumference      Peak Flow      Pain Score 5     Pain Loc      Pain Education      Exclude from Growth Chart     Most recent vital signs: Vitals:   03/18/24 1700 03/18/24 1949  BP: (!) 135/97 (!) 142/110  Pulse: 91 90  Resp: 18 18  Temp: 98 F (36.7 C)   SpO2: 100% 100%   Physical Exam: I have reviewed the vital signs and nursing notes. General: Awake, alert, no acute distress.  Nontoxic appearing. Head:  Atraumatic, normocephalic.   ENT:  EOM intact, PERRL. Oral mucosa is pink and moist with no lesions. Neck: Neck is supple with full range of motion, No meningeal signs. Cardiovascular:  RRR, No murmurs. Peripheral pulses palpable and equal bilaterally. Respiratory:  Symmetrical chest wall expansion.  No rhonchi, rales, or wheezes.  Good air movement throughout.  No use of accessory muscles.    Musculoskeletal:  No cyanosis or edema. Moving extremities with full ROM Abdomen:  Soft, nontender, nondistended. Neuro:  GCS 15, moving all four extremities, interacting appropriately. Speech clear.  Frequent tremoring and tics related to Parkinson's Psych:  Calm, appropriate.   Skin:  Warm, dry, no rash.    ED Results / Procedures / Treatments   Labs (all labs ordered are listed, but only abnormal results are displayed) Labs Reviewed  CBC - Abnormal; Notable for the following components:      Result Value   RBC 3.76 (*)    Hemoglobin 11.3 (*)    HCT 35.1 (*)    All other components within normal limits  BASIC METABOLIC PANEL WITH GFR  TROPONIN I (HIGH SENSITIVITY)  TROPONIN I (HIGH SENSITIVITY)     EKG My EKG interpretation: Rate of 91, normal sinus rhythm, normal axis, normal intervals.  No acute ST elevations or depressions   RADIOLOGY Independently interpreted chest x-ray with no acute pathology   PROCEDURES:  Critical Care performed: No  Procedures   MEDICATIONS ORDERED IN ED:  Medications - No data to display   IMPRESSION / MDM / ASSESSMENT AND PLAN / ED COURSE  I reviewed the triage vital signs and the nursing notes.                              Differential diagnosis includes, but is not limited to, ACS, pneumonia, pneumothorax, parkinsonian symptoms  Patient's presentation is most consistent with acute complicated illness / injury requiring diagnostic workup.  Patient is a 73 year old female presenting today for multiple complaints including chest pain, neck pain, neck stiffness, teeth grinding.  Exam seems consistent with her history of Parkinson's.  Otherwise her vital signs are stable.  EKG unremarkable and troponin negative x 2.  Chest x-ray unremarkable.  Laboratory workup otherwise reassuring.  Does not seem definitive etiology related to her heart and with negative troponins no further workup needed here.  Seems more consistent likely with  parkinsonian symptoms which have potentially progressed.  Recommend she follow-up with neurology.  She is also having some cervical spine and shoulder pain bilaterally which we will have her follow-up with orthopedics.  Otherwise safer discharge and given strict return precautions.     FINAL CLINICAL IMPRESSION(S) / ED DIAGNOSES   Final diagnoses:  Chest pain, unspecified type     Rx / DC Orders   ED Discharge Orders          Ordered    AMB referral to orthopedics        03/18/24 2224             Note:  This document was prepared using Dragon voice recognition software and may include unintentional dictation errors.   Janith Lima, MD 03/18/24 [id number]
